# Patient Record
Sex: Male | Born: 1970 | Race: Black or African American | Hispanic: No | Smoking: Current every day smoker
Health system: Southern US, Community
[De-identification: ages and names within clinical notes are randomized; demographics above are authoritative.]

---

## 2006-11-20 ENCOUNTER — Emergency Department: Payer: Self-pay | Admitting: Unknown Physician Specialty

## 2007-11-13 ENCOUNTER — Emergency Department: Payer: Self-pay | Admitting: Emergency Medicine

## 2008-11-03 ENCOUNTER — Emergency Department: Payer: Self-pay | Admitting: Emergency Medicine

## 2008-11-05 ENCOUNTER — Inpatient Hospital Stay: Payer: Self-pay | Admitting: Orthopedic Surgery

## 2008-11-22 ENCOUNTER — Ambulatory Visit: Payer: Self-pay | Admitting: Physician Assistant

## 2009-02-26 ENCOUNTER — Emergency Department: Payer: Self-pay | Admitting: Emergency Medicine

## 2012-03-19 ENCOUNTER — Emergency Department: Payer: Self-pay | Admitting: Emergency Medicine

## 2019-12-21 ENCOUNTER — Emergency Department
Admission: EM | Admit: 2019-12-21 | Discharge: 2019-12-21 | Disposition: A | Discharge status: Home / Self Care | Payer: Self-pay | Attending: Emergency Medicine | Admitting: Emergency Medicine

## 2019-12-21 ENCOUNTER — Other Ambulatory Visit: Payer: Self-pay

## 2019-12-21 ENCOUNTER — Encounter: Payer: Self-pay | Admitting: Emergency Medicine

## 2019-12-21 DIAGNOSIS — F17210 Nicotine dependence, cigarettes, uncomplicated: Secondary | ICD-10-CM | POA: Insufficient documentation

## 2019-12-21 DIAGNOSIS — L089 Local infection of the skin and subcutaneous tissue, unspecified: Secondary | ICD-10-CM | POA: Insufficient documentation

## 2019-12-21 MED ORDER — SULFAMETHOXAZOLE-TRIMETHOPRIM 800-160 MG PO TABS: 1.0000 | ORAL_TABLET | Freq: Two times a day (BID) | ORAL | 0 refills | Status: DC

## 2019-12-21 MED ORDER — NAPROXEN 500 MG PO TABS: 500.0000 mg | ORAL_TABLET | Freq: Two times a day (BID) | ORAL | Status: DC

## 2019-12-21 NOTE — Discharge Instructions (Signed)
Follow discharge care instruction take medication as directed. °

## 2019-12-21 NOTE — ED Provider Notes (Signed)
[facility] Emergency Department Provider Note   ____________________________________________   First MD Initiated Contact with Patient 12/21/19 1029     (approximate)  I have reviewed the triage vital signs and the nursing notes.   HISTORY  Chief Complaint Insect Bite    HPI Steve Maland. is a 49 y.o. male patient presents with with redness and mild pain to the left lower leg.  Patient suspect insect bite 4 days ago.  Patient denies fever associated with this complaint.  Patient denies loss sensation loss of function.  Patient rates pain as a 2/10.  Patient described pain is "achy".  No palliative measure for complaint.     History reviewed. No pertinent past medical history.  There are no problems to display for this patient.   History reviewed. No pertinent surgical history.  Prior to Admission medications   Medication Sig Start Date End Date Taking? Authorizing Provider  naproxen (NAPROSYN) 500 MG tablet Take 1 tablet (500 mg total) by mouth 2 (two) times daily with a meal. 12/21/19   Joni Reining, PA-C  sulfamethoxazole-trimethoprim (BACTRIM DS) 800-160 MG tablet Take 1 tablet by mouth 2 (two) times daily. 12/21/19   Joni Reining, PA-C    Allergies Patient has no known allergies.  No family history on file.  Social History Social History   Tobacco Use  . Smoking status: Current Every Day Smoker    Types: Cigarettes  . Smokeless tobacco: Never Used  Substance Use Topics  . Alcohol use: Not on file  . Drug use: Not on file    Review of Systems Constitutional: No fever/chills Eyes: No visual changes. ENT: No sore throat. Cardiovascular: Denies chest pain. Respiratory: Denies shortness of breath. Gastrointestinal: No abdominal pain.  No nausea, no vomiting.  No diarrhea.  No constipation. Genitourinary: Negative for dysuria. Musculoskeletal: Negative for back pain. Skin: Negative for rash.  Papular lesions on  erythematous base. Neurological: Negative for headaches, focal weakness or numbness.   ____________________________________________   PHYSICAL EXAM:  VITAL SIGNS: ED Triage Vitals  Enc Vitals Group     BP 12/21/19 0954 139/88     Pulse Rate 12/21/19 0954 87     Resp 12/21/19 0954 16     Temp 12/21/19 0954 98.2 F (36.8 C)     Temp Source 12/21/19 0954 Oral     SpO2 12/21/19 0954 94 %     Weight 12/21/19 0949 260 lb (117.9 kg)     Height 12/21/19 0949 5\' 8"  (1.727 m)     Head Circumference --      Peak Flow --      Pain Score 12/21/19 0948 2     Pain Loc --      Pain Edu? --      Excl. in GC? --    Constitutional: Alert and oriented. Well appearing and in no acute distress. Cardiovascular: Normal rate, regular rhythm. Grossly normal heart sounds.  Good peripheral circulation. Respiratory: Normal respiratory effort.  No retractions. Lungs CTAB. Skin:  Skin is warm, dry and intact.  Papular lesion on erythematous/edematous base. Psychiatric: Mood and affect are normal. Speech and behavior are normal.  ____________________________________________   LABS (all labs ordered are listed, but only abnormal results are displayed)  Labs Reviewed - No data to display ____________________________________________  EKG   ____________________________________________  RADIOLOGY  ED MD interpretation:    Official radiology report(s): No results found.  ____________________________________________   PROCEDURES  Procedure(s) performed (including Critical  Care):  Procedures   ____________________________________________   INITIAL IMPRESSION / ASSESSMENT AND PLAN / ED COURSE  As part of my medical decision making, I reviewed the following data within the [place]     Patient presents with redness and mild swelling to left lower leg.  Physical exam consistent with insect bite with mild cellulitis.  Patient given discharge care instruction advised take  medication as directed.  Patient advised [facility] care with [facility].    Steve Yost. was evaluated in Emergency Department on 12/21/2019 for the symptoms described in the history of present illness. He was evaluated in the context of the global COVID-19 pandemic, which necessitated consideration that the patient might be at risk for infection with the SARS-CoV-2 virus that causes COVID-19. Institutional protocols and algorithms that pertain to the evaluation of patients at risk for COVID-19 are in a state of rapid change based on information released by regulatory bodies including the CDC and federal and state organizations. These policies and algorithms were followed during the patient's care in the ED.       ____________________________________________   FINAL CLINICAL IMPRESSION(S) / ED DIAGNOSES  Final diagnoses:  Skin infection     ED Discharge Orders         Ordered    naproxen (NAPROSYN) 500 MG tablet  2 times daily with meals     Discontinue  Reprint     12/21/19 1058    sulfamethoxazole-trimethoprim (BACTRIM DS) 800-160 MG tablet  2 times daily     Discontinue  Reprint     12/21/19 1058           Note:  This document was prepared using Dragon voice recognition software and may include unintentional dictation errors.    Sable Feil, PA-C 12/21/19 1103    Arta Silence, MD 12/21/19 1115

## 2019-12-21 NOTE — ED Triage Notes (Signed)
States with bitten by a spider on Thursday to left lower leg.  Some redness seen to leg.  NAD

## 2019-12-21 NOTE — ED Notes (Signed)
See triage note  Presents noted to left lower leg  Possible spider bite

## 2020-01-26 ENCOUNTER — Telehealth: Payer: Self-pay | Admitting: General Practice

## 2020-01-26 NOTE — Telephone Encounter (Signed)
Individual has been contacted 3+ times regarding ED referral. No further attempts to contact individual will be made. 

## 2021-04-23 ENCOUNTER — Emergency Department
Admission: EM | Admit: 2021-04-23 | Discharge: 2021-04-23 | Disposition: A | Discharge status: Home / Self Care | Payer: Self-pay | Attending: Emergency Medicine | Admitting: Emergency Medicine

## 2021-04-23 ENCOUNTER — Emergency Department: Payer: Self-pay

## 2021-04-23 ENCOUNTER — Encounter: Payer: Self-pay | Admitting: Emergency Medicine

## 2021-04-23 ENCOUNTER — Other Ambulatory Visit: Payer: Self-pay

## 2021-04-23 DIAGNOSIS — S9002XA Contusion of left ankle, initial encounter: Secondary | ICD-10-CM | POA: Insufficient documentation

## 2021-04-23 DIAGNOSIS — F17210 Nicotine dependence, cigarettes, uncomplicated: Secondary | ICD-10-CM | POA: Insufficient documentation

## 2021-04-23 DIAGNOSIS — Y9301 Activity, walking, marching and hiking: Secondary | ICD-10-CM | POA: Insufficient documentation

## 2021-04-23 DIAGNOSIS — S93492A Sprain of other ligament of left ankle, initial encounter: Secondary | ICD-10-CM | POA: Insufficient documentation

## 2021-04-23 DIAGNOSIS — W19XXXA Unspecified fall, initial encounter: Secondary | ICD-10-CM | POA: Insufficient documentation

## 2021-04-23 MED ORDER — HYDROCODONE-ACETAMINOPHEN 5-325 MG PO TABS
1.0000 | ORAL_TABLET | Freq: Once | ORAL | Status: AC
Start: 2021-04-23 — End: 2021-04-23
  Administered 2021-04-23: 1 via ORAL
  Filled 2021-04-23: qty 1

## 2021-04-23 MED ORDER — NAPROXEN 375 MG PO TABS: 375.0000 mg | ORAL_TABLET | Freq: Two times a day (BID) | ORAL | 0 refills | Status: DC

## 2021-04-23 NOTE — ED Notes (Signed)
See triage note  presents with injury to left ankle  states he stepped in a hole last pm  bruising and swelling noted  unable to bear wt  positive pulses

## 2021-04-23 NOTE — ED Provider Notes (Signed)
[facility] Emergency Department Provider Note ____________________________________________  Time seen: 72  I have reviewed the triage vital signs and the nursing notes.  HISTORY  Chief Complaint  Ankle Pain   HPI Steve Kirk. is a 50 y.o. male presents to the ER today with complaint of left ankle pain, swelling and bruising.  He reports this started last night after he fell.  He reports he was walking down some steps and then stepped into a hole.  He reports he rolled his ankle outward.  He reports he has not been able to bear weight since that time.  He describes the pain as a constant ache but sharp with trying to bear weight.  He denies numbness or tingling.  He did take some Tylenol OTC with minimal relief of symptoms.  He denies prior ankle injury or ankle surgery.  History reviewed. No pertinent past medical history.  There are no problems to display for this patient.   History reviewed. No pertinent surgical history.  Prior to Admission medications   Medication Sig Start Date End Date Taking? Authorizing Provider  naproxen (NAPROSYN) 375 MG tablet Take 1 tablet (375 mg total) by mouth 2 (two) times daily with a meal. 04/23/21  Yes Stashia Sia, Salvadore Oxford, NP    Allergies Patient has no known allergies.  No family history on file.  Social History Social History   Tobacco Use   Smoking status: Every Day    Types: Cigarettes   Smokeless tobacco: Never    Review of Systems  Constitutional: Negative for fever, chills or body aches. Cardiovascular: Negative for chest pain or chest tightness. Respiratory: Negative for cough or shortness of breath. Musculoskeletal: Positive for left ankle pain, swelling and difficulty with gait.  Negative for back, hip or knee pain. Skin: Positive for bruising to the left ankle.  Negative for abrasion. Neurological: Positive for focal weakness of the left lower extremity.  Negative for tingling or  numbness. ____________________________________________  PHYSICAL EXAM:  VITAL SIGNS: ED Triage Vitals  Enc Vitals Group     BP 04/23/21 1003 130/80     Pulse Rate 04/23/21 1003 90     Resp 04/23/21 1003 20     Temp 04/23/21 1003 98.2 F (36.8 C)     Temp Source 04/23/21 1003 Oral     SpO2 04/23/21 1003 95 %     Weight 04/23/21 0952 260 lb 2.3 oz (118 kg)     Height 04/23/21 0952 5\' 8"  (1.727 m)     Head Circumference --      Peak Flow --      Pain Score 04/23/21 0952 10     Pain Loc --      Pain Edu? --      Excl. in GC? --     Constitutional: Alert and oriented.  Appears in pain but in no distress. Head: Normocephalic. Cardiovascular: Normal rate, regular rhythm.  Pedal pulses 2+ bilaterally. Respiratory: Normal respiratory effort. No wheezes/rales/rhonchi noted. Musculoskeletal: Decreased flexion, extension and rotation of the left ankle secondary to pain.  Pain with palpation mostly over the left lateral malleolus.  He is refusing to weight-bear at this time. After xray, he is able to bear weight on the ball of his foot. Neurologic:  Normal speech and language. No gross focal neurologic deficits are appreciated. Skin: Bruising noted posterior and inferior to the left lateral malleolus  ____________________________________________   RADIOLOGY Imaging Orders         DG  Ankle Complete Left    IMPRESSION: Soft tissue swelling. No osseous fracture or dislocation.   ____________________________________________   INITIAL IMPRESSION / ASSESSMENT AND PLAN / ED COURSE Left Ankle Pain, Swelling and Difficulty with Gait status post Fall:  DDx include ankle sprain versus ankle fracture X-ray left ankle negative for fracture Hydrocodone-Acetaminophen 5-325 mg p.o. x1 Ice applied ACE wrap and air cast applied Crutches given RX for Naproxen 375 mg BID x 5 days Encouraged rest, ice and elevation       I reviewed the patient's prescription history over the last 12 months  in the multi-state controlled substances database(s) that includes [place], [place], [place], [place], [place], [place], [place], [place], [place], [place], [place], [place], [place], and [place].  Results were notable for no recent controlled substances ____________________________________________  FINAL CLINICAL IMPRESSION(S) / ED DIAGNOSES  Final diagnoses:  Sprain of posterior talofibular ligament of left ankle, initial encounter      Lorre Munroe, NP 04/23/21 1105    Delton Prairie, MD 04/23/21 1306

## 2021-04-23 NOTE — Discharge Instructions (Addendum)
You were seen today for left ankle pain and swelling.  Your x-ray does not show any evidence of acute fracture.  This is just an ankle sprain.  I have given you anti-inflammatories to take twice daily for the next 5 days.  We encourage elevation and ice.

## 2021-04-23 NOTE — ED Triage Notes (Signed)
Pt reports twisted left ankle last pm and now cannot put weight on it. Swelling and bruising noted

## 2022-04-30 IMAGING — DX DG ANKLE COMPLETE 3+V*L*
3 series · 3 of 3 positions shown · non-contrast
Comparison: None.

CLINICAL DATA: LEFT ankle pain post inversion injury yesterday.

EXAM:
LEFT ANKLE COMPLETE - 3+ VIEW

[ankle ap]
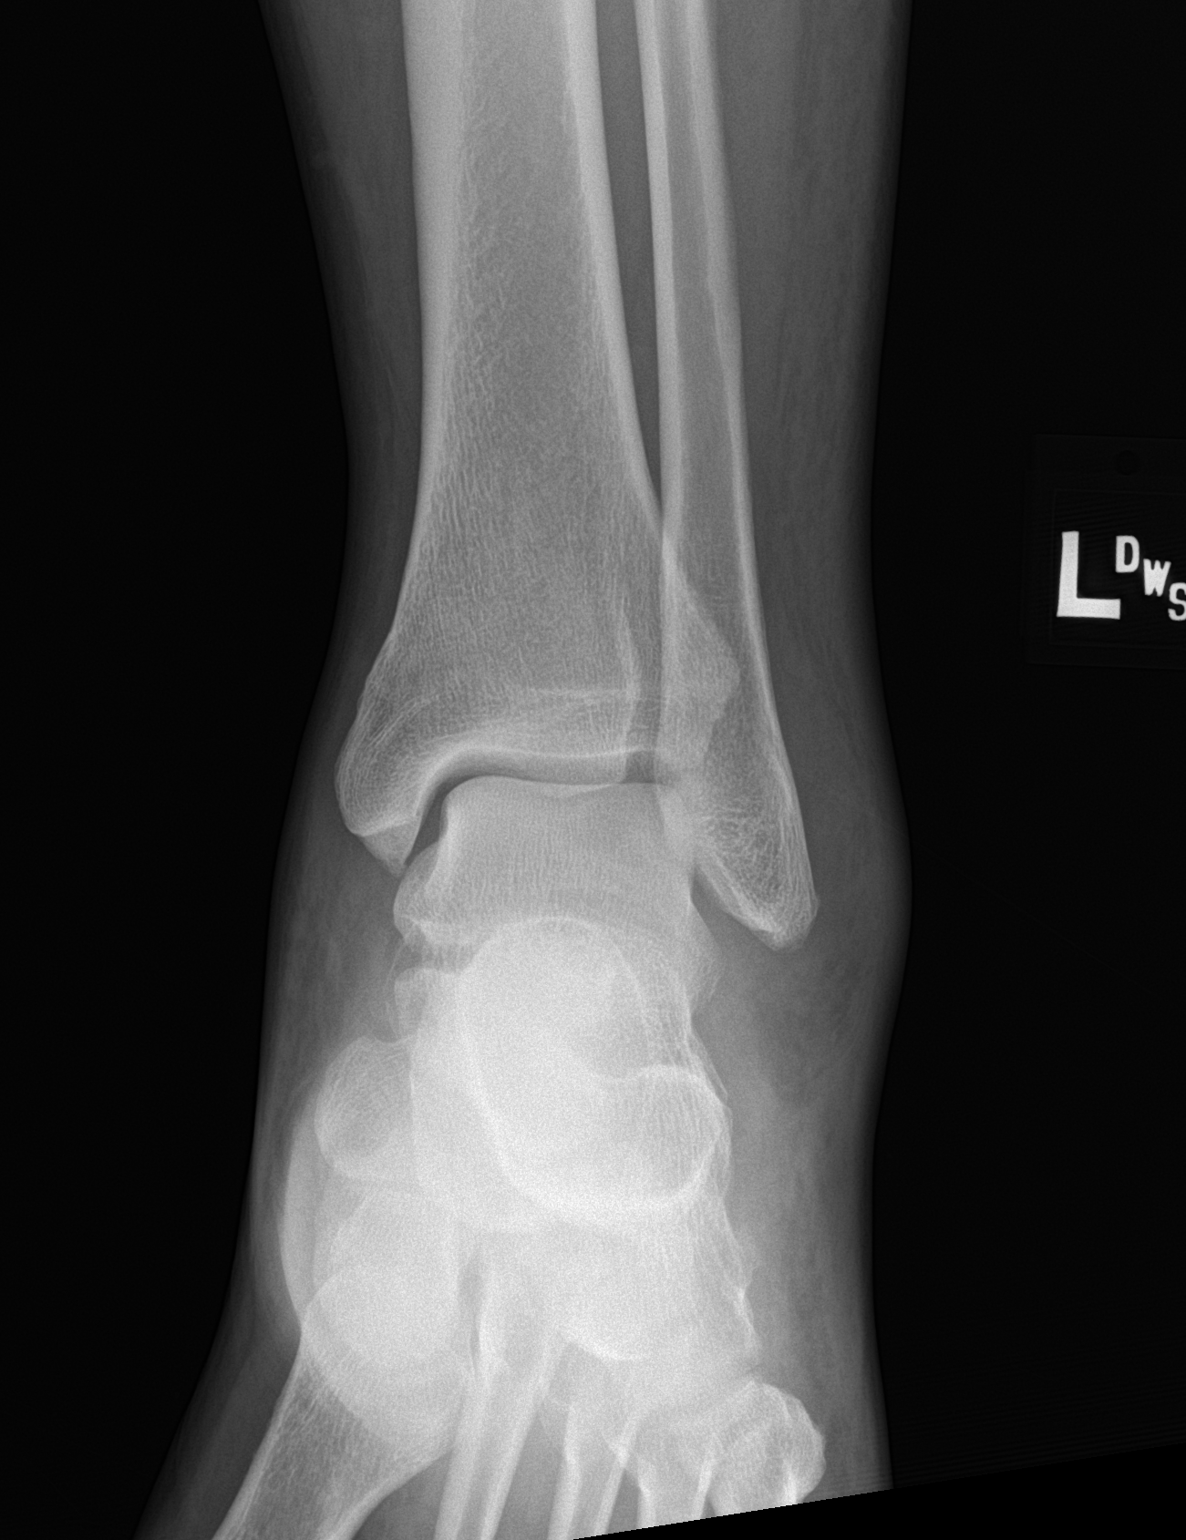

[ankle obl]
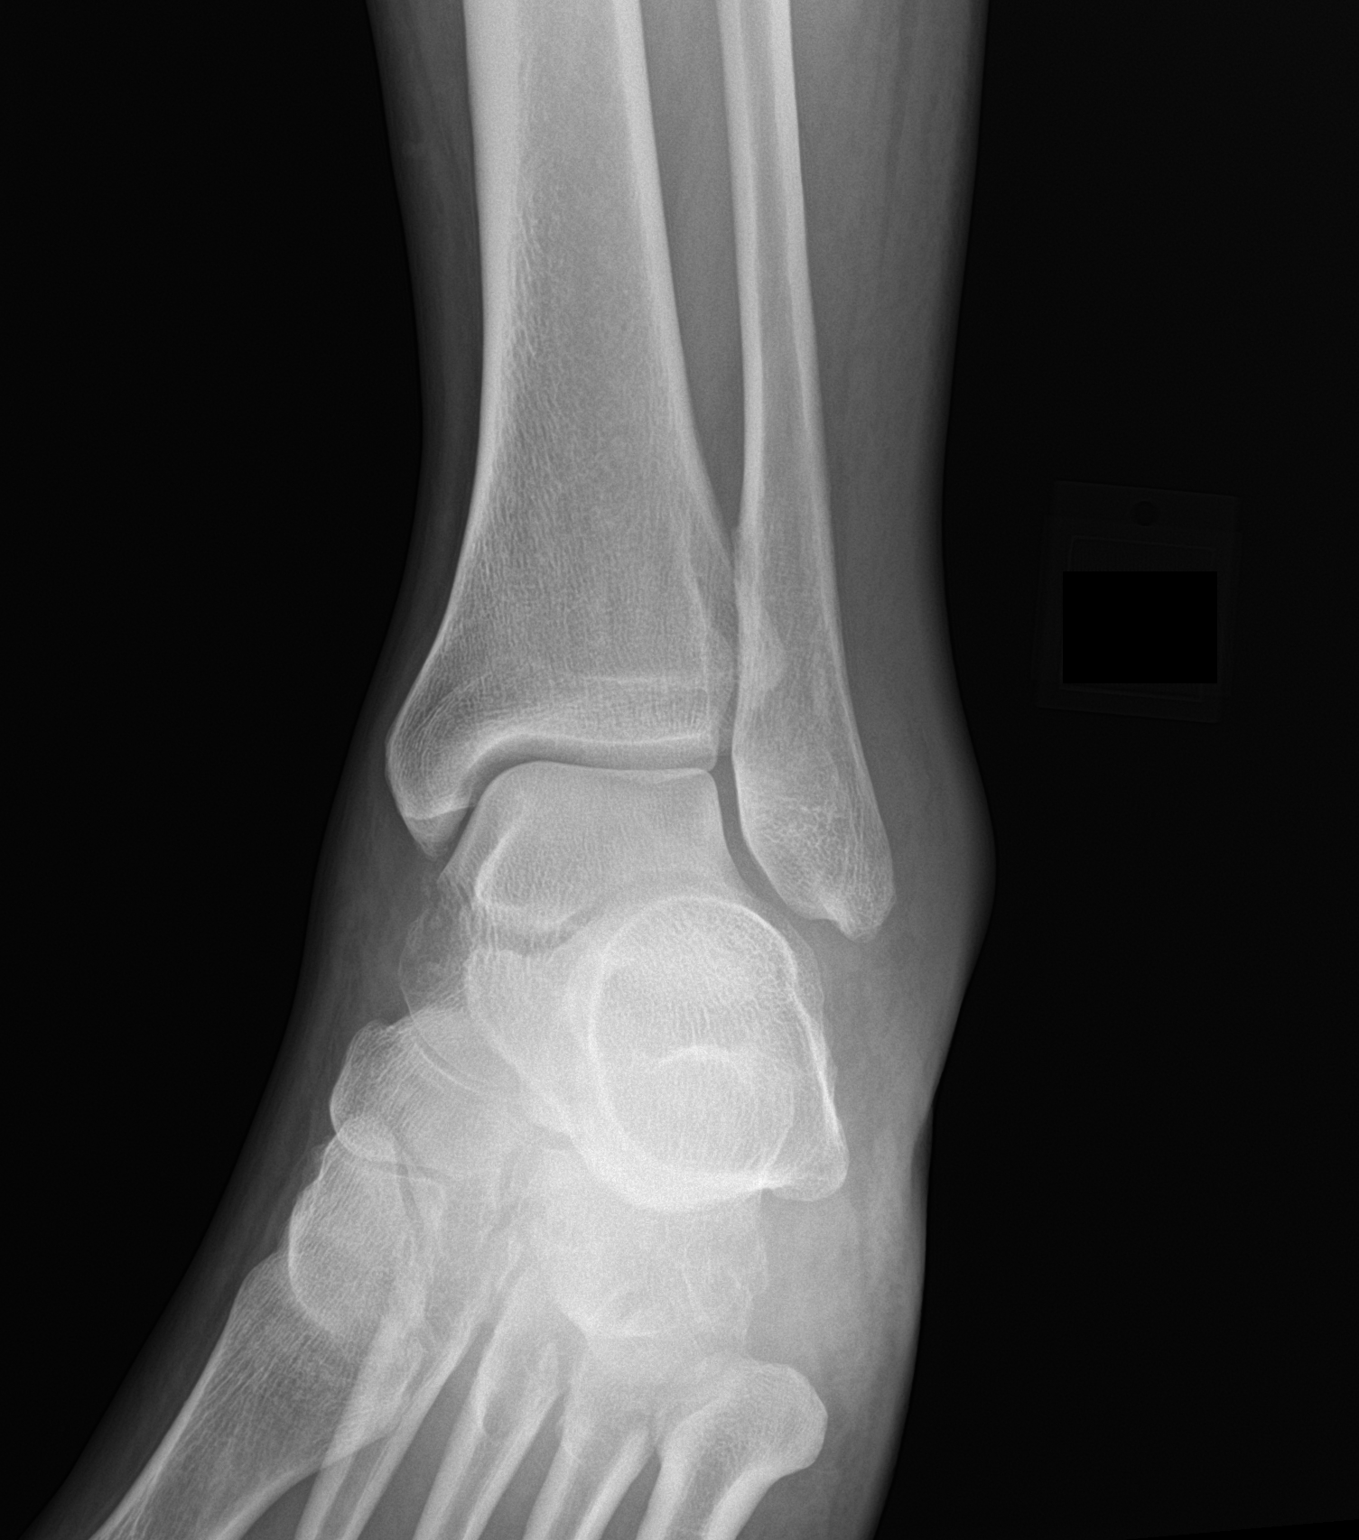

[ankle lat]
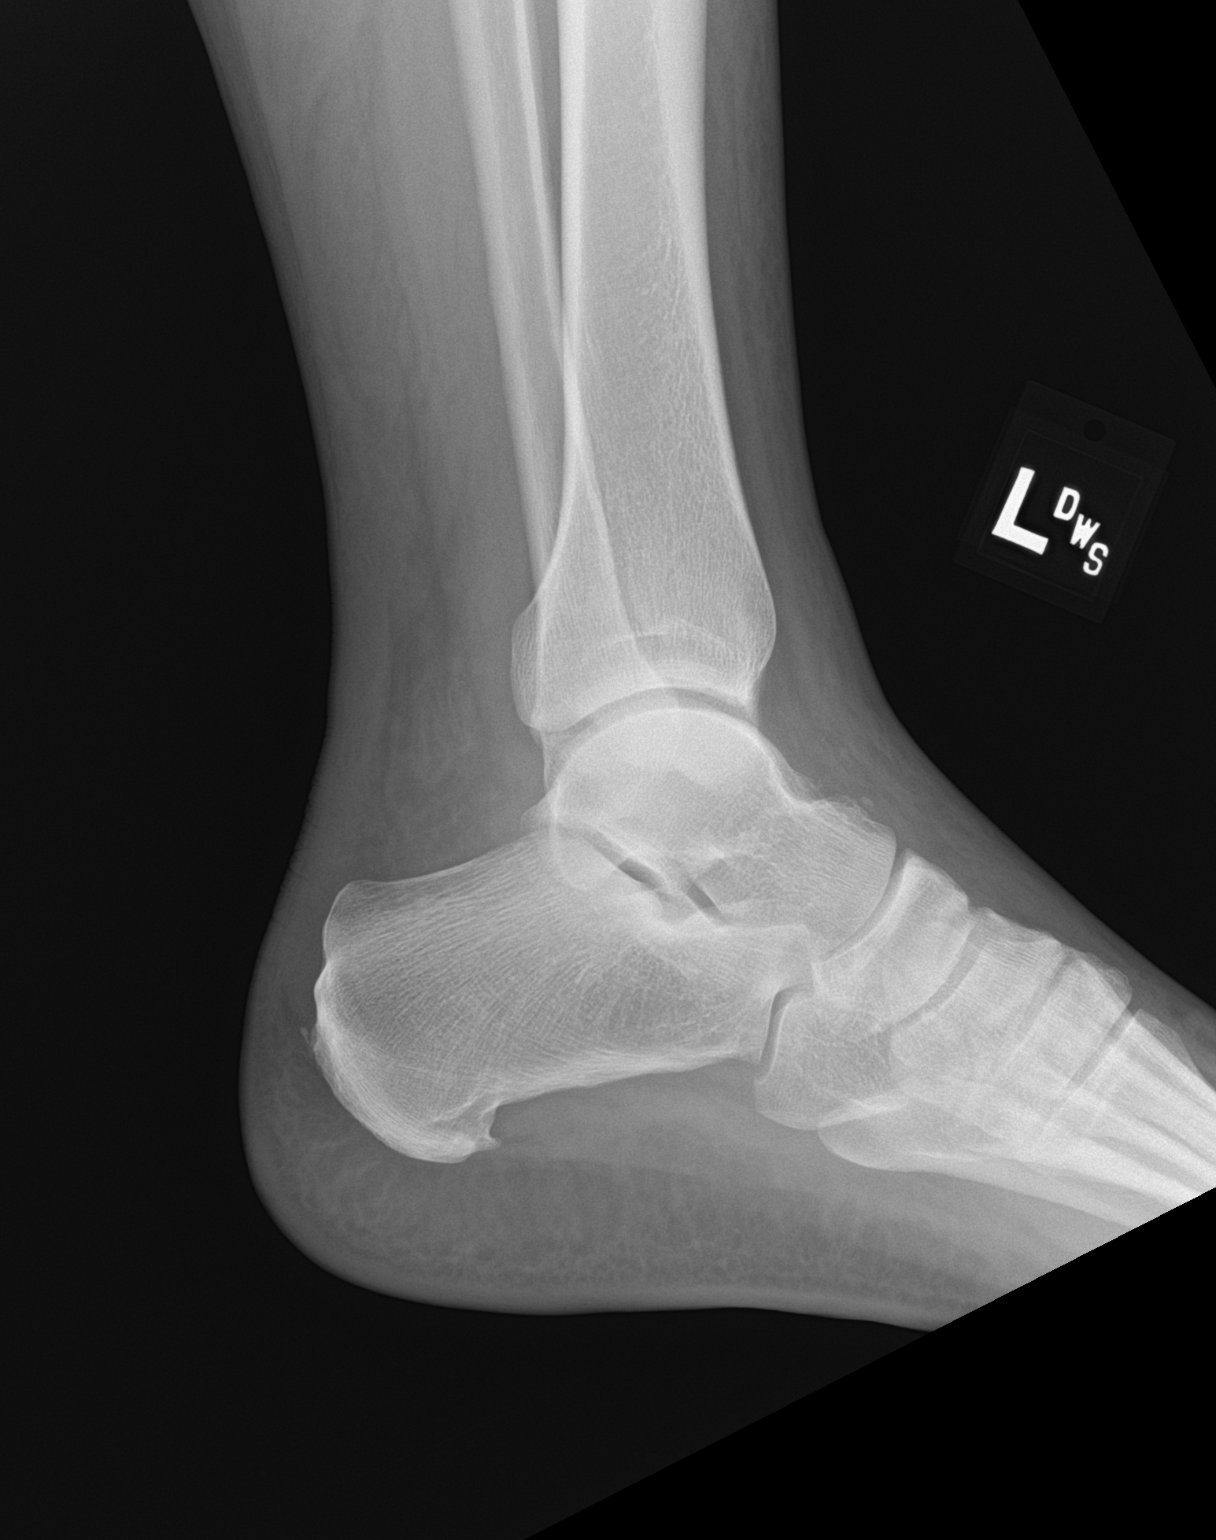

[3 of 3 positions shown; findings below may reference images not displayed]

FINDINGS: Soft tissue swelling over the lateral malleolus. Osseous alignment
is normal. Ankle mortise is symmetric. No fracture line or displaced
fracture fragment seen. Visualized portions of the hindfoot and
midfoot appear intact and normally aligned.
IMPRESSION: Soft tissue swelling. No osseous fracture or dislocation.

## 2022-09-18 ENCOUNTER — Encounter: Payer: Self-pay | Admitting: Family Medicine

## 2022-09-18 ENCOUNTER — Telehealth: Payer: Self-pay

## 2022-09-18 NOTE — Telephone Encounter (Signed)
Unable to contact patient.  Called number listed in chart and male answered stated it was the wrong number.  Thanks, [place], [place]

## 2022-09-19 ENCOUNTER — Other Ambulatory Visit: Payer: Self-pay | Admitting: *Deleted

## 2022-09-19 ENCOUNTER — Telehealth: Payer: Self-pay | Admitting: *Deleted

## 2022-09-19 DIAGNOSIS — Z1211 Encounter for screening for malignant neoplasm of colon: Secondary | ICD-10-CM

## 2022-09-19 MED ORDER — NA SULFATE-K SULFATE-MG SULF 17.5-3.13-1.6 GM/177ML PO SOLN: 1.0000 | Freq: Once | ORAL | 0 refills | Status: AC

## 2022-09-19 NOTE — Telephone Encounter (Signed)
Gastroenterology Pre-Procedure Review  Request Date: 10/03/2022 Requesting Physician: Dr. Vicente Males   PATIENT REVIEW QUESTIONS: The patient responded to the following health history questions as indicated:    1. Are you having any GI issues? no 2. Do you have a personal history of Polyps? no 3. Do you have a family history of Colon Cancer or Polyps? no 4. Diabetes Mellitus? no 5. Joint replacements in the past 12 months?no 6. Major health problems in the past 3 months?no 7. Any artificial heart valves, MVP, or defibrillator?no    MEDICATIONS & ALLERGIES:    Patient reports the following regarding taking any anticoagulation/antiplatelet therapy:   Plavix, Coumadin, Eliquis, Xarelto, Lovenox, Pradaxa, Brilinta, or Effient? no Aspirin? no  Patient confirms/reports the following medications:  No current outpatient medications on file.   No current facility-administered medications for this visit.    Patient confirms/reports the following allergies:  No Known Allergies  No orders of the defined types were placed in this encounter.   AUTHORIZATION INFORMATION Primary Insurance: 1D#: Group #:  Secondary Insurance: 1D#: Group #:  SCHEDULE INFORMATION: Date: 10/03/2022 Time: Location: [place]

## 2022-10-03 ENCOUNTER — Ambulatory Visit
Admission: RE | Admit: 2022-10-03 | Discharge: 2022-10-03 | Disposition: A | Discharge status: Home / Self Care | Payer: 59 | Attending: Gastroenterology | Admitting: Gastroenterology

## 2022-10-03 ENCOUNTER — Ambulatory Visit: Payer: 59 | Admitting: Certified Registered Nurse Anesthetist

## 2022-10-03 ENCOUNTER — Encounter
Admission: RE | Disposition: A | Discharge status: Home / Self Care | Payer: Self-pay | Source: Home / Self Care | Attending: Gastroenterology

## 2022-10-03 ENCOUNTER — Encounter: Payer: Self-pay | Admitting: Gastroenterology

## 2022-10-03 DIAGNOSIS — D128 Benign neoplasm of rectum: Secondary | ICD-10-CM | POA: Insufficient documentation

## 2022-10-03 DIAGNOSIS — D122 Benign neoplasm of ascending colon: Secondary | ICD-10-CM | POA: Diagnosis not present

## 2022-10-03 DIAGNOSIS — F17210 Nicotine dependence, cigarettes, uncomplicated: Secondary | ICD-10-CM | POA: Diagnosis not present

## 2022-10-03 DIAGNOSIS — K621 Rectal polyp: Secondary | ICD-10-CM

## 2022-10-03 DIAGNOSIS — D126 Benign neoplasm of colon, unspecified: Secondary | ICD-10-CM

## 2022-10-03 DIAGNOSIS — Z1211 Encounter for screening for malignant neoplasm of colon: Secondary | ICD-10-CM | POA: Diagnosis not present

## 2022-10-03 DIAGNOSIS — D125 Benign neoplasm of sigmoid colon: Secondary | ICD-10-CM | POA: Diagnosis not present

## 2022-10-03 DIAGNOSIS — D123 Benign neoplasm of transverse colon: Secondary | ICD-10-CM | POA: Insufficient documentation

## 2022-10-03 DIAGNOSIS — K635 Polyp of colon: Secondary | ICD-10-CM | POA: Diagnosis not present

## 2022-10-03 HISTORY — PX: COLONOSCOPY WITH PROPOFOL: SHX5780

## 2022-10-03 SURGERY — COLONOSCOPY WITH PROPOFOL
Anesthesia: General

## 2022-10-03 MED ORDER — SODIUM CHLORIDE 0.9 % IV SOLN
INTRAVENOUS | Status: DC
  Administered 2022-10-03: 20 mL/h via INTRAVENOUS

## 2022-10-03 MED ORDER — LIDOCAINE HCL (PF) 2 % IJ SOLN
INTRAMUSCULAR | Status: AC
  Filled 2022-10-03: qty 15

## 2022-10-03 MED ORDER — SODIUM CHLORIDE 0.9 % IV SOLN: INTRAVENOUS | Status: DC | PRN

## 2022-10-03 MED ORDER — PROPOFOL 1000 MG/100ML IV EMUL
INTRAVENOUS | Status: AC
  Filled 2022-10-03: qty 100

## 2022-10-03 MED ORDER — PHENYLEPHRINE 80 MCG/ML (10ML) SYRINGE FOR IV PUSH (FOR BLOOD PRESSURE SUPPORT)
PREFILLED_SYRINGE | INTRAVENOUS | Status: AC
  Filled 2022-10-03: qty 10

## 2022-10-03 MED ORDER — PROPOFOL 500 MG/50ML IV EMUL
INTRAVENOUS | Status: DC | PRN
  Administered 2022-10-03: 80 mg via INTRAVENOUS
  Administered 2022-10-03: 150 ug/kg/min via INTRAVENOUS
  Administered 2022-10-03: 20 mg via INTRAVENOUS
  Administered 2022-10-03: 180 ug/kg/min via INTRAVENOUS

## 2022-10-03 NOTE — Anesthesia Preprocedure Evaluation (Signed)
Anesthesia Evaluation  Patient identified by MRN, date of birth, ID band Patient awake    Reviewed: Allergy & Precautions, H&P , NPO status , Patient's Chart, lab work & pertinent test results, reviewed documented beta blocker date and time   History of Anesthesia Complications Negative for: history of anesthetic complications  Airway Mallampati: III  TM Distance: >3 FB Neck ROM: full    Dental  (+) Edentulous Upper, Edentulous Lower, Poor Dentition   Pulmonary neg shortness of breath, neg COPD, neg recent URI, Current Smoker and Patient abstained from smoking.   Pulmonary exam normal breath sounds clear to auscultation       Cardiovascular Exercise Tolerance: Good negative cardio ROS Normal cardiovascular exam Rhythm:regular Rate:Normal     Neuro/Psych negative neurological ROS  negative psych ROS   GI/Hepatic negative GI ROS, Neg liver ROS,,,  Endo/Other  negative endocrine ROS    Renal/GU negative Renal ROS  negative genitourinary   Musculoskeletal   Abdominal   Peds  Hematology negative hematology ROS (+)   Anesthesia Other Findings History reviewed. No pertinent past medical history.   Reproductive/Obstetrics negative OB ROS                             Anesthesia Physical Anesthesia Plan  ASA: 2  Anesthesia Plan: General   Post-op Pain Management:    Induction: Intravenous  PONV Risk Score and Plan: 1 and Propofol infusion and TIVA  Airway Management Planned: Natural Airway and Nasal Cannula  Additional Equipment:   Intra-op Plan:   Post-operative Plan:   Informed Consent: I have reviewed the patients History and Physical, chart, labs and discussed the procedure including the risks, benefits and alternatives for the proposed anesthesia with the patient or authorized representative who has indicated his/her understanding and acceptance.     Dental Advisory  Given  Plan Discussed with: Anesthesiologist, CRNA and Surgeon  Anesthesia Plan Comments:        Anesthesia Quick Evaluation

## 2022-10-03 NOTE — H&P (Signed)
     Jonathon Bellows, MD [address] [place], [place], [place], [address] [address], [place], [place], [place], [address] Phone: [phone]  Fax: [phone]  Primary Care Physician:  [facility] [place]   Pre-Procedure History & Physical: HPI:  Steve Kirk. is a 52 y.o. male is here for an colonoscopy.   No past medical history on file.  No past surgical history on file.  Prior to Admission medications   Not on File    Allergies as of 09/19/2022   (No Known Allergies)    No family history on file.  Social History   Socioeconomic History   Marital status: Single    Spouse name: Not on file   Number of children: Not on file   Years of education: Not on file   Highest education level: Not on file  Occupational History   Not on file  Tobacco Use   Smoking status: Every Day    Types: Cigarettes   Smokeless tobacco: Never  Substance and Sexual Activity   Alcohol use: Not on file   Drug use: Not on file   Sexual activity: Not on file  Other Topics Concern   Not on file  Social History Narrative   Not on file   Social Determinants of Health   Financial Resource Strain: Not on file  Food Insecurity: Not on file  Transportation Needs: Not on file  Physical Activity: Not on file  Stress: Not on file  Social Connections: Not on file  Intimate Partner Violence: Not on file    Review of Systems: See HPI, otherwise negative ROS  Physical Exam: BP (!) 140/96   Pulse 86   Temp (!) 96.7 F (35.9 C) (Temporal)   Resp 20   Ht 5\' 8"  (1.727 m)   Wt 112.9 kg   SpO2 97%   BMI 37.86 kg/m  General:   Alert,  pleasant and cooperative in NAD Head:  Normocephalic and atraumatic. Neck:  Supple; no masses or thyromegaly. Lungs:  Clear throughout to auscultation, normal respiratory effort.    Heart:  +S1, +S2, Regular rate and rhythm, No edema. Abdomen:  Soft, nontender and nondistended. Normal bowel sounds, without guarding, and without  rebound.   Neurologic:  Alert and  oriented x4;  grossly normal neurologically.  Impression/Plan: [name]. is here for an colonoscopy to be performed for Screening colonoscopy average risk   Risks, benefits, limitations, and alternatives regarding  colonoscopy have been reviewed with the patient.  Questions have been answered.  All parties agreeable.   Jonathon Bellows, MD  10/03/2022, 7:46 AM

## 2022-10-03 NOTE — Op Note (Signed)
[facility] Gastroenterology Patient Name: Steve Kirk Procedure Date: 10/03/2022 8:17 AM MRN: [id number] Account #: [id number] Date of Birth: 1970-12-21 Admit Type: Outpatient Age: 52 Room: [facility] ENDO ROOM 3 Gender: Male Note Status: Finalized Instrument Name: Jasper Riling [id number] Procedure:             Colonoscopy Indications:           Screening for colorectal malignant neoplasm Providers:             Jonathon Bellows MD, MD Medicines:             Monitored Anesthesia Care Complications:         No immediate complications. Procedure:             Pre-Anesthesia Assessment:                        - Prior to the procedure, a History and Physical was                         performed, and patient medications, allergies and                         sensitivities were reviewed. The patient's tolerance                         of previous anesthesia was reviewed.                        - The risks and benefits of the procedure and the                         sedation options and risks were discussed with the                         patient. All questions were answered and informed                         consent was obtained.                        - ASA Grade Assessment: II - A patient with mild                         systemic disease.                        After obtaining informed consent, the colonoscope was                         passed under direct vision. Throughout the procedure,                         the patient's blood pressure, pulse, and oxygen                         saturations were monitored continuously. The                         Colonoscope was introduced through the anus and  advanced to the the cecum, identified by the                         appendiceal orifice. The colonoscopy was extremely                         difficult. The patient tolerated the procedure well.                         The quality of the bowel preparation  was good. The                         ileocecal valve, appendiceal orifice, and rectum were                         photographed. Findings:      Two sessile polyps were found in the sigmoid colon and transverse colon.       The polyps were 5 to 7 mm in size. These polyps were removed with a cold       snare. Resection and retrieval were complete.      Six sessile polyps were found in the ascending colon. The polyps were 5       to 7 mm in size. These polyps were removed with a cold snare. Resection       and retrieval were complete.      A 20 mm polyp was found in the ascending colon. The polyp was sessile.       Preparations were made for mucosal resection. Demarcation of the lesion       was performed with narrow band imaging to clearly identify the       boundaries of the lesion. Eleview was injected to raise the lesion.       Snare mucosal resection was performed. Resection and retrieval were       complete. Resected tissue margins were examined and clear of polyp       tissue. To prevent bleeding after mucosal resection, four hemostatic       clips were successfully placed. There was no bleeding at the end of the       procedure.      A 30 mm polyp was found in the rectum. The polyp was sessile. Area was       successfully injected with 5 mL Eleview for a lift polypectomy. The       polyp was removed with a piecemeal technique using a hot snare.       Resection was complete, and retrieval was complete. Fulguration to       ablate the lesion remnants by snare was successful. To prevent bleeding       after the polypectomy, seven hemostatic clips were successfully placed.       There was no bleeding during, or at the end, of the procedure.      The exam was otherwise without abnormality on direct and retroflexion       views. Impression:            - Two 5 to 7 mm polyps in the sigmoid colon and in the                         transverse colon, removed with a cold snare. Resected  and retrieved.                        - Six 5 to 7 mm polyps in the ascending colon, removed                         with a cold snare. Resected and retrieved.                        - One 20 mm polyp in the ascending colon, removed with                         mucosal resection. Resected and retrieved. Clips were                         placed.                        - One 30 mm polyp in the rectum, removed piecemeal                         using a hot snare. Resected and retrieved. Injected.                         Treated with a hot snare. Clips were placed.                        - The examination was otherwise normal on direct and                         retroflexion views.                        - Mucosal resection was performed. Resection and                         retrieval were complete. Recommendation:        - Discharge patient to home (with escort).                        - Resume previous diet.                        - No ibuprofen, naproxen, or other non-steroidal                         anti-inflammatory drugs for 6 weeks after polyp                         removal.                        - Await pathology results.                        - Repeat colonoscopy in 6 months for surveillance                         after piecemeal polypectomy. Procedure Code(s):     --- Professional ---  [id number], Colonoscopy, flexible; with endoscopic mucosal                         resection                        45385, 66, Colonoscopy, flexible; with removal of                         tumor(s), polyp(s), or other lesion(s) by snare                         technique                        45381, 65, Colonoscopy, flexible; with directed                         submucosal injection(s), any substance Diagnosis Code(s):     --- Professional ---                        Z12.11, Encounter for screening for malignant neoplasm                         of colon                         D12.5, Benign neoplasm of sigmoid colon                        D12.3, Benign neoplasm of transverse colon (hepatic                         flexure or splenic flexure)                        D12.2, Benign neoplasm of ascending colon                        D12.8, Benign neoplasm of rectum CPT copyright 2022 American Medical Association. All rights reserved. The codes documented in this report are preliminary and upon coder review may  be revised to meet current compliance requirements. Jonathon Bellows, MD Jonathon Bellows MD, MD 10/03/2022 9:29:09 AM This report has been signed electronically. Number of Addenda: 0 Note Initiated On: 10/03/2022 8:17 AM Scope Withdrawal Time: 0 hours 49 minutes 41 seconds  Total Procedure Duration: 0 hours 54 minutes 26 seconds  Estimated Blood Loss:  Estimated blood loss: none.      [facility]

## 2022-10-03 NOTE — Anesthesia Postprocedure Evaluation (Signed)
Anesthesia Post Note  Patient: Steve Kirk.  Procedure(s) Performed: COLONOSCOPY WITH PROPOFOL  Patient location during evaluation: Endoscopy Anesthesia Type: General Level of consciousness: awake and alert Pain management: pain level controlled Vital Signs Assessment: post-procedure vital signs reviewed and stable Respiratory status: spontaneous breathing, nonlabored ventilation, respiratory function stable and patient connected to nasal cannula oxygen Cardiovascular status: blood pressure returned to baseline and stable Postop Assessment: no apparent nausea or vomiting Anesthetic complications: no   No notable events documented.   Last Vitals:  Vitals:   10/03/22 0936 10/03/22 0946  BP: 123/76 109/77  Pulse: 75 75  Resp: 13 14  Temp:    SpO2: 96% 100%    Last Pain:  Vitals:   10/03/22 0946  TempSrc:   PainSc: 0-No pain                 Martha Clan

## 2022-10-03 NOTE — Transfer of Care (Signed)
Immediate Anesthesia Transfer of Care Note  Patient: Steve Kirk.  Procedure(s) Performed: COLONOSCOPY WITH PROPOFOL  Patient Location: PACU and Endoscopy Unit  Anesthesia Type:General  Level of Consciousness: drowsy  Airway & Oxygen Therapy: Patient Spontanous Breathing  Post-op Assessment: Report given to RN and Post -op Vital signs reviewed and stable  Post vital signs: Reviewed and stable  Last Vitals:  Vitals Value Taken Time  BP 108/68 10/03/22 0927  Temp    Pulse 83 10/03/22 0927  Resp 14 10/03/22 0927  SpO2 96 % 10/03/22 0927  Vitals shown include unvalidated device data.  Last Pain:  Vitals:   10/03/22 0719  TempSrc: Temporal  PainSc: 0-No pain         Complications: No notable events documented.

## 2022-10-04 ENCOUNTER — Encounter: Payer: Self-pay | Admitting: Gastroenterology

## 2022-10-04 LAB — SURGICAL PATHOLOGY

## 2022-10-09 ENCOUNTER — Encounter: Payer: Self-pay | Admitting: Gastroenterology

## 2022-10-09 ENCOUNTER — Telehealth: Payer: Self-pay

## 2022-10-09 NOTE — Telephone Encounter (Signed)
Called patient to let him know that his pathology showed pre cancerous and that Dr. Vicente Males removed a large polyp and he is recommending a repeat colonoscopy in 6 months to make sure everything is doing well. Patient agreed and had no further questions.

## 2022-10-09 NOTE — Progress Notes (Signed)
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

## 2022-10-09 NOTE — Telephone Encounter (Signed)
-----   Message from Jonathon Bellows, MD sent at 10/09/2022  8:16 AM EDT ----- Inform polyps were pre cancerous, large one taken out piece meal - repeat colonoscopy in 6 months

## 2023-02-19 ENCOUNTER — Telehealth: Payer: Self-pay

## 2023-02-19 NOTE — Telephone Encounter (Signed)
Spouse advised that there is not a DPR on file and we are not able to speak to her regarding the patient's health information...  She stated she will have pt call back to schedule

## 2023-03-05 NOTE — Telephone Encounter (Signed)
a repeat colonoscopy in 6 months per Dr Tobi Bastos

## 2023-03-05 NOTE — Telephone Encounter (Addendum)
The patient and his wife called in to schedule his colonoscopy. The patient gave [place] verbal consent to speak to his wife Teressa Lower) on his behave.

## 2023-03-06 ENCOUNTER — Other Ambulatory Visit: Payer: Self-pay | Admitting: *Deleted

## 2023-03-06 ENCOUNTER — Telehealth: Payer: Self-pay | Admitting: *Deleted

## 2023-03-06 DIAGNOSIS — Z86010 Personal history of colonic polyps: Secondary | ICD-10-CM

## 2023-03-06 MED ORDER — NA SULFATE-K SULFATE-MG SULF 17.5-3.13-1.6 GM/177ML PO SOLN: 1.0000 | Freq: Once | ORAL | 0 refills | Status: AC

## 2023-03-06 NOTE — Telephone Encounter (Signed)
Steve Kirk  03/05/23  4:22 PM  Note The patient and his wife called in to schedule his colonoscopy. The patient gave [place] verbal consent to speak to his wife Steve Kirk) on his behave.   Roena Malady, [place]  02/19/23 12:11 PM  Note Spouse advised that there is not a DPR on file and we are not able to speak to her regarding the patient's health information...   She stated she will have pt call back to schedule

## 2023-03-06 NOTE — Telephone Encounter (Signed)
Colonoscopy schedule on 04/10/2023

## 2023-03-06 NOTE — Telephone Encounter (Signed)
This is a recall for a 6 months repeat Colonoscopy due to piecemeal polypectomy.

## 2023-04-10 ENCOUNTER — Ambulatory Visit
Admission: RE | Admit: 2023-04-10 | Discharge: 2023-04-10 | Disposition: A | Discharge status: Home / Self Care | Payer: 59 | Attending: Gastroenterology | Admitting: Gastroenterology

## 2023-04-10 ENCOUNTER — Other Ambulatory Visit: Payer: Self-pay

## 2023-04-10 ENCOUNTER — Encounter: Payer: Self-pay | Admitting: Gastroenterology

## 2023-04-10 ENCOUNTER — Encounter
Admission: RE | Disposition: A | Discharge status: Home / Self Care | Payer: Self-pay | Source: Home / Self Care | Attending: Gastroenterology

## 2023-04-10 ENCOUNTER — Ambulatory Visit: Payer: 59 | Admitting: Anesthesiology

## 2023-04-10 DIAGNOSIS — K635 Polyp of colon: Secondary | ICD-10-CM

## 2023-04-10 DIAGNOSIS — Z1211 Encounter for screening for malignant neoplasm of colon: Secondary | ICD-10-CM

## 2023-04-10 DIAGNOSIS — K5730 Diverticulosis of large intestine without perforation or abscess without bleeding: Secondary | ICD-10-CM | POA: Diagnosis not present

## 2023-04-10 DIAGNOSIS — D122 Benign neoplasm of ascending colon: Secondary | ICD-10-CM | POA: Diagnosis not present

## 2023-04-10 DIAGNOSIS — Z860100 Personal history of colon polyps, unspecified: Secondary | ICD-10-CM | POA: Diagnosis not present

## 2023-04-10 DIAGNOSIS — F17210 Nicotine dependence, cigarettes, uncomplicated: Secondary | ICD-10-CM | POA: Diagnosis not present

## 2023-04-10 DIAGNOSIS — D128 Benign neoplasm of rectum: Secondary | ICD-10-CM | POA: Insufficient documentation

## 2023-04-10 DIAGNOSIS — Z09 Encounter for follow-up examination after completed treatment for conditions other than malignant neoplasm: Secondary | ICD-10-CM | POA: Diagnosis not present

## 2023-04-10 HISTORY — PX: HEMOSTASIS CLIP PLACEMENT: SHX6857

## 2023-04-10 HISTORY — PX: HOT HEMOSTASIS: SHX5433

## 2023-04-10 HISTORY — PX: POLYPECTOMY: SHX5525

## 2023-04-10 HISTORY — PX: SUBMUCOSAL LIFTING INJECTION: SHX6855

## 2023-04-10 HISTORY — PX: COLONOSCOPY WITH PROPOFOL: SHX5780

## 2023-04-10 SURGERY — COLONOSCOPY WITH PROPOFOL
Anesthesia: General

## 2023-04-10 MED ORDER — SODIUM CHLORIDE (PF) 0.9 % IJ SOLN
INTRAMUSCULAR | Status: DC | PRN
  Administered 2023-04-10: 6 mL

## 2023-04-10 MED ORDER — PROPOFOL 10 MG/ML IV BOLUS
INTRAVENOUS | Status: DC | PRN
  Administered 2023-04-10: 120 ug/kg/min via INTRAVENOUS
  Administered 2023-04-10: 120 mg via INTRAVENOUS

## 2023-04-10 MED ORDER — PROPOFOL 10 MG/ML IV BOLUS
INTRAVENOUS | Status: AC
  Filled 2023-04-10: qty 20

## 2023-04-10 MED ORDER — SODIUM CHLORIDE 0.9 % IV SOLN: INTRAVENOUS | Status: DC

## 2023-04-10 NOTE — H&P (Signed)
  Wyline Mood, MD [address], [place], [place], [address] [address], [place], [place], [address] Phone: [phone]  Fax: [phone]  Primary Care Physician:  [facility]   Pre-Procedure History & Physical: HPI:  Steve Moomaw. is a 52 y.o. male is here for an colonoscopy.   No past medical history on file.  Past Surgical History:  Procedure Laterality Date   COLONOSCOPY WITH PROPOFOL N/A 10/03/2022   Procedure: COLONOSCOPY WITH PROPOFOL;  Surgeon: Wyline Mood, MD;  Location: [facility] ENDOSCOPY;  Service: Gastroenterology;  Laterality: N/A;    Prior to Admission medications   Not on File    Allergies as of 03/06/2023   (No Known Allergies)    No family history on file.  Social History   Socioeconomic History   Marital status: Single    Spouse name: Not on file   Number of children: Not on file   Years of education: Not on file   Highest education level: Not on file  Occupational History   Not on file  Tobacco Use   Smoking status: Every Day    Types: Cigarettes   Smokeless tobacco: Never  Substance and Sexual Activity   Alcohol use: Not on file   Drug use: Not on file   Sexual activity: Not on file  Other Topics Concern   Not on file  Social History Narrative   Not on file   Social Determinants of Health   Financial Resource Strain: Not on file  Food Insecurity: Not on file  Transportation Needs: Not on file  Physical Activity: Not on file  Stress: Not on file  Social Connections: Not on file  Intimate Partner Violence: Not on file    Review of Systems: See HPI, otherwise negative ROS  Physical Exam: There were no vitals taken for this visit. General:   Alert,  pleasant and cooperative in NAD Head:  Normocephalic and atraumatic. Neck:  Supple; no masses or thyromegaly. Lungs:  Clear throughout to auscultation, normal respiratory effort.    Heart:  +S1, +S2, Regular rate and rhythm, No  edema. Abdomen:  Soft, nontender and nondistended. Normal bowel sounds, without guarding, and without rebound.   Neurologic:  Alert and  oriented x4;  grossly normal neurologically.  Impression/Plan: Steve Potash. is here for an colonoscopy to be performed for surveillance due to prior history of colon polyps   Risks, benefits, limitations, and alternatives regarding  colonoscopy have been reviewed with the patient.  Questions have been answered.  All parties agreeable.   Wyline Mood, MD  04/10/2023, 10:20 AM

## 2023-04-10 NOTE — Op Note (Signed)
[facility] Gastroenterology Patient Name: Steve Kirk Procedure Date: 04/10/2023 10:51 AM MRN: [id number] Account #: [id number] Date of Birth: 1971/07/09 Admit Type: Outpatient Age: 52 Room: [facility] ENDO ROOM 3 Gender: Male Note Status: Finalized Instrument Name: Nelda Marseille [id number] Procedure:             Colonoscopy Indications:           Surveillance: Piecemeal removal of large sessile                         adenoma last colonoscopy (< 3 yrs), Last colonoscopy:                         March 2024 Providers:             Wyline Mood MD, MD Referring MD:          [facility] Medicines:             Monitored Anesthesia Care Complications:         No immediate complications. Procedure:             Pre-Anesthesia Assessment:                        - Prior to the procedure, a History and Physical was                         performed, and patient medications, allergies and                         sensitivities were reviewed. The patient's tolerance                         of previous anesthesia was reviewed.                        - The risks and benefits of the procedure and the                         sedation options and risks were discussed with the                         patient. All questions were answered and informed                         consent was obtained.                        - ASA Grade Assessment: II - A patient with mild                         systemic disease.                        After obtaining informed consent, the colonoscope was                         passed under direct vision. Throughout the procedure,                         the patient's blood pressure, pulse,  and oxygen                         saturations were monitored continuously. The                         Colonoscope was introduced through the anus and                         advanced to the the cecum, identified by the                         appendiceal  orifice. The colonoscopy was performed                         with ease. The patient tolerated the procedure well.                         The quality of the bowel preparation was excellent.                         The ileocecal valve, appendiceal orifice, and rectum                         were photographed. Findings:      The perianal and digital rectal examinations were normal.      A 15 mm polyp was found in the proximal ascending colon. The polyp was       sessile. The polyp was removed with a hot snare. The polyp was removed       with a saline injection-lift technique using a hot snare. Resection and       retrieval were complete. To prevent bleeding after the polypectomy, two       hemostatic clips were successfully placed. There was no bleeding at the       end of the procedure.      A 5 mm polyp was found in the rectum. The polyp was sessile. The polyp       was removed with a cold snare. Resection and retrieval were complete.      An 8 mm polyp was found in the rectum. The polyp was sessile. The polyp       was removed with a hot snare. Resection and retrieval were complete.       This was at prior polypectomy site      The exam was otherwise without abnormality on direct and retroflexion       views. Impression:            - One 15 mm polyp in the proximal ascending colon,                         removed with a hot snare and removed using                         injection-lift and a hot snare. Resected and                         retrieved. Clips were placed.                        -  One 5 mm polyp in the rectum, removed with a cold                         snare. Resected and retrieved.                        - One 8 mm polyp in the rectum, removed with a hot                         snare. Resected and retrieved.                        - The examination was otherwise normal on direct and                         retroflexion views. Recommendation:        - Discharge patient  to home (with escort).                        - Resume previous diet.                        - Continue present medications.                        - Await pathology results.                        - Repeat colonoscopy for surveillance based on                         pathology results. Procedure Code(s):     --- Professional ---                        [phone], Colonoscopy, flexible; with removal of                         tumor(s), polyp(s), or other lesion(s) by snare                         technique                        [id number], Colonoscopy, flexible; with directed submucosal                         injection(s), any substance Diagnosis Code(s):     --- Professional ---                        Z86.010, Personal history of colonic polyps                        D12.8, Benign neoplasm of rectum                        D12.2, Benign neoplasm of ascending colon CPT copyright 2022 American Medical Association. All rights reserved. The codes documented in this report are preliminary and upon coder review may  be revised to meet current compliance requirements. Wyline Mood, MD Wyline Mood MD, MD 04/10/2023 11:30:24 AM This report  has been signed electronically. Number of Addenda: 0 Note Initiated On: 04/10/2023 10:51 AM Scope Withdrawal Time: 0 hours 19 minutes 37 seconds  Total Procedure Duration: 0 hours 22 minutes 35 seconds  Estimated Blood Loss:  Estimated blood loss: none.      [facility]

## 2023-04-10 NOTE — Transfer of Care (Signed)
Immediate Anesthesia Transfer of Care Note  Patient: Steve Kirk.  Procedure(s) Performed: COLONOSCOPY WITH PROPOFOL POLYPECTOMY HOT HEMOSTASIS (ARGON PLASMA COAGULATION/BICAP) HEMOSTASIS CLIP PLACEMENT SUBMUCOSAL LIFTING INJECTION  Patient Location: Endoscopy Unit  Anesthesia Type:General  Level of Consciousness: awake, alert , and oriented  Airway & Oxygen Therapy: Patient Spontanous Breathing  Post-op Assessment: Report given to RN and Post -op Vital signs reviewed and stable  Post vital signs: Reviewed and stable  Last Vitals:  Vitals Value Taken Time  BP 108/81 04/10/23 1134  Temp 36.8 C 04/10/23 1133  Pulse 86 04/10/23 1134  Resp 19 04/10/23 1134  SpO2 98 % 04/10/23 1134  Vitals shown include unfiled device data.  Last Pain:  Vitals:   04/10/23 1133  TempSrc: Temporal  PainSc: 0-No pain         Complications: No notable events documented.

## 2023-04-10 NOTE — Anesthesia Preprocedure Evaluation (Signed)
Anesthesia Evaluation  Patient identified by MRN, date of birth, ID band Patient awake    Reviewed: Allergy & Precautions, H&P , NPO status , Patient's Chart, lab work & pertinent test results, reviewed documented beta blocker date and time   History of Anesthesia Complications Negative for: history of anesthetic complications  Airway Mallampati: III  TM Distance: >3 FB Neck ROM: full    Dental  (+) Edentulous Upper, Edentulous Lower, Poor Dentition   Pulmonary neg shortness of breath, neg COPD, neg recent URI, Current Smoker and Patient abstained from smoking.   Pulmonary exam normal breath sounds clear to auscultation       Cardiovascular Exercise Tolerance: Good negative cardio ROS Normal cardiovascular exam Rhythm:regular Rate:Normal     Neuro/Psych negative neurological ROS  negative psych ROS   GI/Hepatic negative GI ROS, Neg liver ROS,,,  Endo/Other  negative endocrine ROS    Renal/GU negative Renal ROS  negative genitourinary   Musculoskeletal   Abdominal   Peds  Hematology negative hematology ROS (+)   Anesthesia Other Findings History reviewed. No pertinent past medical history.   Reproductive/Obstetrics negative OB ROS                             Anesthesia Physical Anesthesia Plan  ASA: 2  Anesthesia Plan: General   Post-op Pain Management:    Induction: Intravenous  PONV Risk Score and Plan: 1 and Propofol infusion and TIVA  Airway Management Planned: Natural Airway and Nasal Cannula  Additional Equipment:   Intra-op Plan:   Post-operative Plan:   Informed Consent: I have reviewed the patients History and Physical, chart, labs and discussed the procedure including the risks, benefits and alternatives for the proposed anesthesia with the patient or authorized representative who has indicated his/her understanding and acceptance.     Dental Advisory  Given  Plan Discussed with: Anesthesiologist, CRNA and Surgeon  Anesthesia Plan Comments:        Anesthesia Quick Evaluation

## 2023-04-11 ENCOUNTER — Encounter: Payer: Self-pay | Admitting: Gastroenterology

## 2023-04-15 LAB — SURGICAL PATHOLOGY

## 2023-04-16 ENCOUNTER — Encounter: Payer: Self-pay | Admitting: Gastroenterology

## 2023-04-20 NOTE — Anesthesia Postprocedure Evaluation (Signed)
Anesthesia Post Note  Patient: Steve Kirk.  Procedure(s) Performed: COLONOSCOPY WITH PROPOFOL POLYPECTOMY HOT HEMOSTASIS (ARGON PLASMA COAGULATION/BICAP) HEMOSTASIS CLIP PLACEMENT SUBMUCOSAL LIFTING INJECTION  Patient location during evaluation: Endoscopy Anesthesia Type: General Level of consciousness: awake and alert Pain management: pain level controlled Vital Signs Assessment: post-procedure vital signs reviewed and stable Respiratory status: spontaneous breathing, nonlabored ventilation, respiratory function stable and patient connected to nasal cannula oxygen Cardiovascular status: blood pressure returned to baseline and stable Postop Assessment: no apparent nausea or vomiting Anesthetic complications: no   No notable events documented.   Last Vitals:  Vitals:   04/10/23 1133 04/10/23 1143  BP: 108/81   Pulse: 85 75  Resp: 20   Temp: 36.8 C   SpO2: 98% 100%    Last Pain:  Vitals:   04/11/23 0726  TempSrc:   PainSc: 0-No pain                 Lenard Simmer

## 2024-05-06 ENCOUNTER — Emergency Department

## 2024-05-06 ENCOUNTER — Emergency Department
Admission: EM | Admit: 2024-05-06 | Discharge: 2024-05-06 | Disposition: A | Discharge status: Home / Self Care | Attending: Emergency Medicine | Admitting: Emergency Medicine

## 2024-05-06 ENCOUNTER — Other Ambulatory Visit: Payer: Self-pay

## 2024-05-06 DIAGNOSIS — M25512 Pain in left shoulder: Secondary | ICD-10-CM | POA: Insufficient documentation

## 2024-05-06 MED ORDER — MELOXICAM 15 MG PO TABS
15.0000 mg | ORAL_TABLET | Freq: Every day | ORAL | 0 refills | Status: AC
Start: 2024-05-06 — End: ?

## 2024-05-06 NOTE — ED Triage Notes (Addendum)
 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

## 2024-05-06 NOTE — ED Provider Notes (Signed)
   [facility] Provider Note    Event Date/Time   First MD Initiated Contact with Patient 05/06/24 1209     (approximate)   History   Shoulder Pain   HPI  Steve Kirk. is a 53 y.o. male with not significant past medical history and as listed in EMR presents to the emergency department for treatment of left shoulder pain x 6 months. Symptoms started after moving an air conditioning unit. Pain increases with abduction.   Physical Exam    Vitals:   05/06/24 1152 05/06/24 1314  BP: (!) 175/86 128/83  Pulse: (!) 108 100  Resp: 17 18  Temp: 98.3 F (36.8 C)   SpO2: 100% 95%    General: Awake, no distress.  CV:  Good peripheral perfusion.  Resp:  Normal effort.  Abd:  No distention.  Other:  Pain limits abduction past about 90 degrees. No arm drop.   ED Results / Procedures / Treatments   Labs (all labs ordered are listed, but only abnormal results are displayed)  Labs Reviewed - No data to display   EKG  Not indicated.   RADIOLOGY  Image and radiology report reviewed and interpreted by me. Radiology report consistent with the same.  Image of the left shoulder shows no evidence of acute concerns.  PROCEDURES:  Critical Care performed: No  Procedures   MEDICATIONS ORDERED IN ED:  Medications - No data to display   IMPRESSION / MDM / ASSESSMENT AND PLAN / ED COURSE   I have reviewed the triage note and vital signs. Vital signs stable   Differential diagnosis includes, but is not limited to, osteoarthritis, musculoskeletal strain, tendinitis  Patient's presentation is most consistent with acute injury requiring diagnostic workup.  53 year old male presenting to the emergency department for treatment and evaluation of left shoulder pain that seems to be getting worse.  No specific injury but loosely associates onset of pain after moving an air conditioning unit about 6 months ago.  On exam, he has pain with abduction  past 90 degrees.  X-ray shows no evidence of acute concerns.  Bones are osteopenic.  Plan will be to treat patient with meloxicam and have him follow-up with primary care if not improving.      FINAL CLINICAL IMPRESSION(S) / ED DIAGNOSES   Final diagnoses:  Acute pain of left shoulder     Rx / DC Orders   ED Discharge Orders          Ordered    meloxicam (MOBIC) 15 MG tablet  Daily        05/06/24 1524             Note:  This document was prepared using Dragon voice recognition software and may include unintentional dictation errors.   Herlinda Kirk NOVAK, FNP 05/06/24 1533    Viviann Pastor, MD 05/07/24 [phone]

## 2024-05-06 NOTE — ED Notes (Signed)
 Patient transported to X-ray
